# Patient Record
Sex: Female | Born: 1960 | Race: White | Hispanic: No | Marital: Married | Smoking: Never smoker
Health system: Southern US, Community
[De-identification: ages and names within clinical notes are randomized; demographics above are authoritative.]

## PROBLEM LIST (undated history)

## (undated) DIAGNOSIS — F0390 Unspecified dementia without behavioral disturbance: Secondary | ICD-10-CM

## (undated) DIAGNOSIS — E119 Type 2 diabetes mellitus without complications: Secondary | ICD-10-CM

## (undated) DIAGNOSIS — I10 Essential (primary) hypertension: Secondary | ICD-10-CM

## (undated) DIAGNOSIS — E785 Hyperlipidemia, unspecified: Secondary | ICD-10-CM

## (undated) HISTORY — PX: KNEE ARTHROSCOPY: SUR90

## (undated) HISTORY — PX: FRACTURE SURGERY: SHX138

## (undated) HISTORY — PX: ABDOMINAL HYSTERECTOMY: SHX81

## (undated) HISTORY — PX: JOINT REPLACEMENT: SHX530

## (undated) HISTORY — PX: ABDOMINAL SURGERY: SHX537

---

## 2004-12-10 ENCOUNTER — Encounter: Admission: RE | Admit: 2004-12-10 | Discharge: 2005-02-27 | Payer: Self-pay | Admitting: Orthopedic Surgery

## 2005-05-13 ENCOUNTER — Emergency Department (HOSPITAL_COMMUNITY): Admission: EM | Admit: 2005-05-13 | Discharge: 2005-05-13 | Payer: Self-pay | Admitting: *Deleted

## 2009-01-13 DIAGNOSIS — E119 Type 2 diabetes mellitus without complications: Secondary | ICD-10-CM | POA: Insufficient documentation

## 2009-01-13 DIAGNOSIS — Z96659 Presence of unspecified artificial knee joint: Secondary | ICD-10-CM | POA: Insufficient documentation

## 2009-01-13 DIAGNOSIS — Z90710 Acquired absence of both cervix and uterus: Secondary | ICD-10-CM | POA: Insufficient documentation

## 2009-01-30 ENCOUNTER — Ambulatory Visit: Payer: Self-pay | Admitting: Internal Medicine

## 2009-01-30 ENCOUNTER — Emergency Department: Payer: Self-pay

## 2012-11-28 ENCOUNTER — Ambulatory Visit: Payer: Self-pay | Admitting: Family Medicine

## 2014-01-18 DIAGNOSIS — G894 Chronic pain syndrome: Secondary | ICD-10-CM | POA: Insufficient documentation
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
## 2015-05-18 DIAGNOSIS — S46111A Strain of muscle, fascia and tendon of long head of biceps, right arm, initial encounter: Secondary | ICD-10-CM | POA: Insufficient documentation

## 2015-08-24 DIAGNOSIS — Z9889 Other specified postprocedural states: Secondary | ICD-10-CM | POA: Insufficient documentation

## 2018-01-27 ENCOUNTER — Emergency Department
Admission: EM | Admit: 2018-01-27 | Discharge: 2018-01-27 | Disposition: A | Discharge status: Home / Self Care | Payer: BLUE CROSS/BLUE SHIELD | Attending: Student in an Organized Health Care Education/Training Program | Admitting: Student in an Organized Health Care Education/Training Program

## 2018-01-27 ENCOUNTER — Other Ambulatory Visit: Payer: Self-pay

## 2018-01-27 ENCOUNTER — Emergency Department: Payer: BLUE CROSS/BLUE SHIELD

## 2018-01-27 DIAGNOSIS — R0602 Shortness of breath: Secondary | ICD-10-CM | POA: Diagnosis not present

## 2018-01-27 DIAGNOSIS — L237 Allergic contact dermatitis due to plants, except food: Secondary | ICD-10-CM

## 2018-01-27 MED ORDER — ALBUTEROL SULFATE (2.5 MG/3ML) 0.083% IN NEBU
2.5000 mg | INHALATION_SOLUTION | Freq: Once | RESPIRATORY_TRACT | Status: AC
  Administered 2018-01-27: 2.5 mg via RESPIRATORY_TRACT
  Filled 2018-01-27: qty 3

## 2018-01-27 MED ORDER — ALBUTEROL SULFATE HFA 108 (90 BASE) MCG/ACT IN AERS: 2.0000 | INHALATION_SPRAY | RESPIRATORY_TRACT | 1 refills | Status: DC | PRN

## 2018-01-27 MED ORDER — PREDNISONE 10 MG PO TABS: 50.0000 mg | ORAL_TABLET | Freq: Every day | ORAL | 0 refills | Status: DC

## 2018-01-27 MED ORDER — PREDNISONE 20 MG PO TABS
60.0000 mg | ORAL_TABLET | Freq: Once | ORAL | Status: AC
  Administered 2018-01-27: 60 mg via ORAL
  Filled 2018-01-27: qty 3
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
## 2018-01-27 NOTE — Discharge Instructions (Signed)
Please follow-up with your primary care provider for symptoms that are not improving over the next 24 to 48 hours. Return to the emergency department for symptoms of concern if unable to schedule appointment.
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
## 2021-07-10 ENCOUNTER — Telehealth: Payer: Self-pay | Admitting: Primary Care

## 2021-07-10 NOTE — Telephone Encounter (Signed)
Attempted to reach patient to offer to schedule a Palliative Consult with no answer - left message with reason for call along with my name and call back number, requesting a return call to schedule visit and also to answer any questions regarding our Palliative services.

## 2021-07-25 ENCOUNTER — Telehealth: Payer: Self-pay | Admitting: Primary Care

## 2021-07-25 NOTE — Telephone Encounter (Signed)
Spoke with Isabella Mack Gave regarding the Palliative referral/services and answered all questions and she was in agreement with scheduling visit.  I have scheduled a Telehealth Consult for 08/06/21 @ 12:30 PM.

## 2021-08-06 ENCOUNTER — Other Ambulatory Visit: Payer: Self-pay | Admitting: Primary Care

## 2021-08-06 ENCOUNTER — Other Ambulatory Visit: Payer: Self-pay

## 2021-08-06 ENCOUNTER — Encounter: Payer: Self-pay | Admitting: Primary Care

## 2021-08-06 DIAGNOSIS — Z515 Encounter for palliative care: Secondary | ICD-10-CM

## 2021-08-06 DIAGNOSIS — F0280 Dementia in other diseases classified elsewhere without behavioral disturbance: Secondary | ICD-10-CM

## 2021-08-06 NOTE — Progress Notes (Addendum)
° ° °  [facility] Palliative Care Consult Note Telephone: [phone]  Fax: [phone]     Due to the COVID-19 crisis, this visit was done via telemedicine from my office and it was initiated and consent by this patient and or family.  I connected with  Isabella Mack OR PROXY on 08/06/21 by a video enabled telemedicine application and verified that I am speaking with the correct person using two identifiers.   I discussed the limitations of evaluation and management by telemedicine. The patient expressed understanding and agreed to proceed.  Date of encounter: 08/06/21 PATIENT NAME: Isabella Mack [id number]   [phone] (home) [phone] (work) DOB: Sep 16, 1960 MRN: [id number] PRIMARY CARE PROVIDER:    Trey Paula MD [place], [place]  REFERRING PROVIDER:   Joan Mayans, MD [address] [place],  [place] [address] [phone]  RESPONSIBLE PARTY:    Contact Information     Name Relation Home Work Mobile   Isabella Mack [place]    [phone]       Palliative Care was asked to follow this patient by consultation request of  Isabella Mayans, MD to address advance care planning.                                   ASSESSMENT AND PLAN / RECOMMENDATIONS:   Advance Care Planning/Goals of Care: Goals include to maximize quality of life and symptom management. Patient/health care surrogate gave his/her permission to discuss.Our advance care planning conversation included a discussion about:    The value and importance of advance care planning  Experiences with loved ones who have been seriously ill or have died  Exploration of personal, cultural or spiritual beliefs that might influence medical decisions  Exploration of goals of care in the event of a sudden injury or illness  Identification and preparation of a healthcare agent - Wife Review  of an  advance directive document . Decision not to resuscitate or to  de-escalate disease focused treatments due to poor prognosis. CODE STATUS: DNR No most, did not want to make one at this time.  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 Patient is no longer continent of bladder and needs much assistance with adls. She is on 40 mg seroquel bid, which helps with agitation/anger. Has diabetes with a1c wnl.  She is up at night pacing and eats a lot but is losing weight.  Needs resources for care giving due to increased dementia behaviors. Wife outlines their marriage and that at that time they were encouraged to discuss advance care planning.  Gave area resources for dementia care.Wife to call back if in home visit needed.   Follow up Palliative Care Visit: Palliative care will continue to follow for complex medical decision making, advance care planning, and clarification of goals. Family will call for follow up for in home palliative care.   I spent 30 minutes providing this consultation. More than 50% of the time in this consultation was spent in counseling and care coordination.  Thank you for the opportunity to participate in the care of Ms. Isabella Mack.  The palliative care team will continue to follow. Please call our office at [phone] if we can be of additional assistance.   Isabella Coop, NP ,  [facility]

## 2021-10-09 ENCOUNTER — Other Ambulatory Visit: Payer: Self-pay

## 2021-10-09 ENCOUNTER — Emergency Department: Payer: 59

## 2021-10-09 ENCOUNTER — Emergency Department
Admission: EM | Admit: 2021-10-09 | Discharge: 2021-10-09 | Disposition: A | Discharge status: Home / Self Care | Payer: 59 | Attending: Emergency Medicine | Admitting: Emergency Medicine

## 2021-10-09 DIAGNOSIS — M7989 Other specified soft tissue disorders: Secondary | ICD-10-CM

## 2021-10-09 DIAGNOSIS — E119 Type 2 diabetes mellitus without complications: Secondary | ICD-10-CM | POA: Diagnosis not present

## 2021-10-09 DIAGNOSIS — F0390 Unspecified dementia without behavioral disturbance: Secondary | ICD-10-CM | POA: Diagnosis not present

## 2021-10-09 HISTORY — DX: Type 2 diabetes mellitus without complications: E11.9

## 2021-10-09 HISTORY — DX: Unspecified dementia, unspecified severity, without behavioral disturbance, psychotic disturbance, mood disturbance, and anxiety: F03.90

## 2021-10-09 LAB — CBC WITH DIFFERENTIAL/PLATELET
Abs Immature Granulocytes: 0.02 10*3/uL (ref 0.00–0.07)
Basophils Absolute: 0 10*3/uL (ref 0.0–0.1)
Basophils Relative: 1 %
Eosinophils Absolute: 0.1 10*3/uL (ref 0.0–0.5)
Eosinophils Relative: 2 %
HCT: 37.6 % (ref 36.0–46.0)
Hemoglobin: 12.6 g/dL (ref 12.0–15.0)
Immature Granulocytes: 0 %
Lymphocytes Relative: 23 %
Lymphs Abs: 1.5 10*3/uL (ref 0.7–4.0)
MCH: 29.7 pg (ref 26.0–34.0)
MCHC: 33.5 g/dL (ref 30.0–36.0)
MCV: 88.7 fL (ref 80.0–100.0)
Monocytes Absolute: 0.4 10*3/uL (ref 0.1–1.0)
Monocytes Relative: 7 %
Neutro Abs: 4.3 10*3/uL (ref 1.7–7.7)
Neutrophils Relative %: 67 %
Platelets: 187 10*3/uL (ref 150–400)
RBC: 4.24 MIL/uL (ref 3.87–5.11)
RDW: 12.4 % (ref 11.5–15.5)
WBC: 6.3 10*3/uL (ref 4.0–10.5)
nRBC: 0 % (ref 0.0–0.2)

## 2021-10-09 LAB — COMPREHENSIVE METABOLIC PANEL
ALT: 7 U/L (ref 0–44)
AST: 17 U/L (ref 15–41)
Albumin: 4 g/dL (ref 3.5–5.0)
Alkaline Phosphatase: 70 U/L (ref 38–126)
Anion gap: 10 (ref 5–15)
BUN: 20 mg/dL (ref 6–20)
CO2: 25 mmol/L (ref 22–32)
Calcium: 9.3 mg/dL (ref 8.9–10.3)
Chloride: 102 mmol/L (ref 98–111)
Creatinine, Ser: 0.78 mg/dL (ref 0.44–1.00)
GFR, Estimated: >60 mL/min (ref >60)
Glucose, Bld: 201 mg/dL — ABNORMAL HIGH (ref 70–99)
Potassium: 4.1 mmol/L (ref 3.5–5.1)
Sodium: 137 mmol/L (ref 135–145)
Total Bilirubin: 0.9 mg/dL (ref 0.3–1.2)
Total Protein: 7.3 g/dL (ref 6.5–8.1)

## 2021-10-09 LAB — LACTIC ACID, PLASMA
Lactic Acid, Venous: 1.5 mmol/L (ref 0.5–1.9)
Lactic Acid, Venous: 1 mmol/L (ref 0.5–1.9)

## 2021-10-09 LAB — VALPROIC ACID LEVEL: Valproic Acid Lvl: <10 ug/mL — ABNORMAL LOW (ref 50.0–100.0)

## 2021-10-09 MED ORDER — DOXYCYCLINE MONOHYDRATE 100 MG PO TABS: 100.0000 mg | ORAL_TABLET | Freq: Two times a day (BID) | ORAL | 0 refills | Status: DC

## 2021-10-09 MED ORDER — CEPHALEXIN 500 MG PO CAPS: 500.0000 mg | ORAL_CAPSULE | Freq: Four times a day (QID) | ORAL | 0 refills | Status: DC

## 2021-10-09 NOTE — Discharge Instructions (Signed)
Take Keflex 4 times daily for the next 7 days. ?Take doxycycline twice daily for the next 7 days. ?Please start probiotic. ?

## 2021-10-09 NOTE — ED Triage Notes (Addendum)
Patient to ER with wife, dementia. Patient with redness and swelling present to right lower calf/ foot. Wife reports noticing the site today. No weeping.  ? ?Patient with sciatic neuropathy/ diabetes.  ?

## 2021-10-09 NOTE — ED Provider Notes (Signed)
? ?[facility] ?Provider Note ? ?Patient Contact: 6:38 PM (approximate) ? ? ?History  ? ?Leg Swelling ? ? ?HPI ? ?Isabella Mack is a 61 y.o. female with a history of diabetes and dementia, presents to the emergency department with new right lower extremity swelling, edema and erythema.  Patient's wife reports that patient has been afebrile.  Wife reports that patient struggles with anxiety and paces often.  She does not smoke or use exogenous hormones.  No prior history of DVT or PE.  No perceived shortness of breath at home. ? ?  ? ? ?Physical Exam  ? ?Triage Vital Signs: ?ED Triage Vitals  ?Enc Vitals Group  ?   BP 10/09/21 1633 125/77  ?   Pulse Rate 10/09/21 1633 89  ?   Resp 10/09/21 1633 17  ?   Temp 10/09/21 1630 98.6 ?F (37 ?C)  ?   Temp src --   ?   SpO2 10/09/21 1633 99 %  ?   Weight 10/09/21 1750 194 lb 0.1 oz (88 kg)  ?   Height 10/09/21 1630 5\' 8"  (1.727 m)  ?   Head Circumference --   ?   Peak Flow --   ?   Pain Score --   ?   Pain Loc --   ?   Pain Edu? --   ?   Excl. in GC? --   ? ? ?Most recent vital signs: ?Vitals:  ? 10/09/21 1633 10/09/21 1800  ?BP: 125/77 120/77  ?Pulse: 89 89  ?Resp: 17 16  ?Temp:    ?SpO2: 99% 99%  ? ? ? ?General: Alert and in no acute distress. ?Eyes:  PERRL. EOMI. ?Head: No acute traumatic findings ?ENT: ?     Ears:  ?     Nose: No congestion/rhinnorhea. ?     Mouth/Throat: Mucous membranes are moist. ?Neck: No stridor. No cervical spine tenderness to palpation. ?Cardiovascular:  Good peripheral perfusion ?Respiratory: Normal respiratory effort without tachypnea or retractions. Lungs CTAB. Good air entry to the bases with no decreased or absent breath sounds. ?Gastrointestinal: Bowel sounds ?4 quadrants. Soft and nontender to palpation. No guarding or rigidity. No palpable masses. No distention. No CVA tenderness. ?Musculoskeletal: Full range of motion to all extremities.  ?Neurologic:  No gross focal neurologic deficits are appreciated.  ?Skin:  Patient has erythema and edema of the right lower extremity.  Patient has 3+ pitting edema on the right.  Palpable dorsalis pedis pulse, right. ? ? ? ?ED Results / Procedures / Treatments  ? ?Labs ?(all labs ordered are listed, but only abnormal results are displayed) ?Labs Reviewed  ?COMPREHENSIVE METABOLIC PANEL - Abnormal; Notable for the following components:  ?    Result Value  ? Glucose, Bld 201 (*)   ? All other components within normal limits  ?VALPROIC ACID LEVEL - Abnormal; Notable for the following components:  ? Valproic Acid Lvl <10 (*)   ? All other components within normal limits  ?LACTIC ACID, PLASMA  ?LACTIC ACID, PLASMA  ?CBC WITH DIFFERENTIAL/PLATELET  ?URINALYSIS, ROUTINE W REFLEX MICROSCOPIC  ? ? ? ? ? ? ?RADIOLOGY ? ?I personally viewed and evaluated these images as part of my medical decision making, as well as reviewing the written report by the radiologist. ? ?ED Provider Interpretation: I personally reviewed venous ultrasound and there was no evidence of DVT.  I agree with radiologist interpretation. ? ? ?PROCEDURES: ? ?Critical Care performed: No ? ?Procedures ? ? ?MEDICATIONS ORDERED  IN ED: ?Medications - No data to display ? ? ?IMPRESSION / MDM / ASSESSMENT AND PLAN / ED COURSE  ?I reviewed the triage vital signs and the nursing notes. ?             ?               ? ?Assessment and plan:  ?Leg swelling:  ?Differential diagnosis includes, but is not limited to, cellulitis versus DVT, subtherapeutic Depakote level ? ?61 year old female presents to the emergency department with new lower extremity edema.  On exam, patient had petechiae and 3+ pitting edema.  Patient was asymptomatic on the left. ? ?Patient had normal white blood cell count and reassuring CBC and CMP.  Lactic within range.  We will obtain venous ultrasound on the right and will reassess. ? ?Venous ultrasound shows no signs of DVT.  Suspect cellulitis at this time.  We will treat with doxycycline and Keflex.  Return  precautions were given to return to the emergency department with new or worsening symptoms. ? ?FINAL CLINICAL IMPRESSION(S) / ED DIAGNOSES  ? ?Final diagnoses:  ?Leg swelling  ? ? ? ?Rx / DC Orders  ? ?ED Discharge Orders   ? ?      Ordered  ?  doxycycline (ADOXA) 100 MG tablet  2 times daily,   Status:  Discontinued       ? 10/09/21 2031  ?  cephALEXin (KEFLEX) 500 MG capsule  4 times daily,   Status:  Discontinued       ? 10/09/21 2031  ?  cephALEXin (KEFLEX) 500 MG capsule  4 times daily       ? 10/09/21 2042  ?  doxycycline (ADOXA) 100 MG tablet  2 times daily       ? 10/09/21 2042  ? ?  ?  ? ?  ? ? ? ?Note:  This document was prepared using Dragon voice recognition software and may include unintentional dictation errors. ?  ?Orvil Feil, PA-C ?10/09/21 2318 ? ?  ?Merwyn Katos, MD ?10/10/21 2158 ? ?

## 2021-10-09 NOTE — ED Notes (Signed)
See triage note  presents with some swelling and redness to right ankle and lower leg  family states she is not aware of pt injury  2+ edema note to lower extremitry ? ?

## 2021-10-09 NOTE — ED Notes (Signed)
US at bedside

## 2021-10-13 ENCOUNTER — Other Ambulatory Visit: Payer: Self-pay

## 2021-10-13 ENCOUNTER — Inpatient Hospital Stay
Admission: EM | Admit: 2021-10-13 | Discharge: 2021-10-16 | DRG: 603 | Disposition: A | Discharge status: Home / Self Care | Payer: 59 | Attending: Internal Medicine | Admitting: Internal Medicine

## 2021-10-13 DIAGNOSIS — R233 Spontaneous ecchymoses: Secondary | ICD-10-CM | POA: Diagnosis present

## 2021-10-13 DIAGNOSIS — Z96651 Presence of right artificial knee joint: Secondary | ICD-10-CM | POA: Diagnosis present

## 2021-10-13 DIAGNOSIS — Z79899 Other long term (current) drug therapy: Secondary | ICD-10-CM

## 2021-10-13 DIAGNOSIS — E785 Hyperlipidemia, unspecified: Secondary | ICD-10-CM | POA: Diagnosis not present

## 2021-10-13 DIAGNOSIS — Z66 Do not resuscitate: Secondary | ICD-10-CM | POA: Diagnosis present

## 2021-10-13 DIAGNOSIS — E1142 Type 2 diabetes mellitus with diabetic polyneuropathy: Secondary | ICD-10-CM | POA: Diagnosis present

## 2021-10-13 DIAGNOSIS — E119 Type 2 diabetes mellitus without complications: Secondary | ICD-10-CM

## 2021-10-13 DIAGNOSIS — L03115 Cellulitis of right lower limb: Secondary | ICD-10-CM | POA: Diagnosis not present

## 2021-10-13 DIAGNOSIS — R21 Rash and other nonspecific skin eruption: Secondary | ICD-10-CM | POA: Diagnosis present

## 2021-10-13 DIAGNOSIS — Z7984 Long term (current) use of oral hypoglycemic drugs: Secondary | ICD-10-CM

## 2021-10-13 DIAGNOSIS — Z8249 Family history of ischemic heart disease and other diseases of the circulatory system: Secondary | ICD-10-CM

## 2021-10-13 DIAGNOSIS — Z7952 Long term (current) use of systemic steroids: Secondary | ICD-10-CM

## 2021-10-13 DIAGNOSIS — F0393 Unspecified dementia, unspecified severity, with mood disturbance: Secondary | ICD-10-CM | POA: Diagnosis present

## 2021-10-13 DIAGNOSIS — I10 Essential (primary) hypertension: Secondary | ICD-10-CM | POA: Diagnosis not present

## 2021-10-13 DIAGNOSIS — G8929 Other chronic pain: Secondary | ICD-10-CM | POA: Diagnosis present

## 2021-10-13 DIAGNOSIS — F0390 Unspecified dementia without behavioral disturbance: Secondary | ICD-10-CM | POA: Diagnosis present

## 2021-10-13 DIAGNOSIS — F32A Depression, unspecified: Secondary | ICD-10-CM | POA: Diagnosis present

## 2021-10-13 DIAGNOSIS — Z825 Family history of asthma and other chronic lower respiratory diseases: Secondary | ICD-10-CM

## 2021-10-13 DIAGNOSIS — R4701 Aphasia: Secondary | ICD-10-CM | POA: Diagnosis present

## 2021-10-13 DIAGNOSIS — Z888 Allergy status to other drugs, medicaments and biological substances status: Secondary | ICD-10-CM

## 2021-10-13 HISTORY — DX: Hyperlipidemia, unspecified: E78.5

## 2021-10-13 HISTORY — DX: Essential (primary) hypertension: I10

## 2021-10-13 LAB — CBC WITH DIFFERENTIAL/PLATELET
Abs Immature Granulocytes: 0.04 10*3/uL (ref 0.00–0.07)
Basophils Absolute: 0 10*3/uL (ref 0.0–0.1)
Basophils Relative: 0 %
Eosinophils Absolute: 0.1 10*3/uL (ref 0.0–0.5)
Eosinophils Relative: 1 %
HCT: 37.9 % (ref 36.0–46.0)
Hemoglobin: 12.5 g/dL (ref 12.0–15.0)
Immature Granulocytes: 0 %
Lymphocytes Relative: 9 %
Lymphs Abs: 1 10*3/uL (ref 0.7–4.0)
MCH: 29.8 pg (ref 26.0–34.0)
MCHC: 33 g/dL (ref 30.0–36.0)
MCV: 90.5 fL (ref 80.0–100.0)
Monocytes Absolute: 0.6 10*3/uL (ref 0.1–1.0)
Monocytes Relative: 5 %
Neutro Abs: 9.1 10*3/uL — ABNORMAL HIGH (ref 1.7–7.7)
Neutrophils Relative %: 85 %
Platelets: 216 10*3/uL (ref 150–400)
RBC: 4.19 MIL/uL (ref 3.87–5.11)
RDW: 12.2 % (ref 11.5–15.5)
WBC: 10.8 10*3/uL — ABNORMAL HIGH (ref 4.0–10.5)
nRBC: 0 % (ref 0.0–0.2)

## 2021-10-13 LAB — GLUCOSE, CAPILLARY
Glucose-Capillary: 165 mg/dL — ABNORMAL HIGH (ref 70–99)
Glucose-Capillary: 128 mg/dL — ABNORMAL HIGH (ref 70–99)

## 2021-10-13 LAB — PROTIME-INR
INR: 1 (ref 0.8–1.2)
Prothrombin Time: 13.5 seconds (ref 11.4–15.2)

## 2021-10-13 LAB — COMPREHENSIVE METABOLIC PANEL
ALT: 11 U/L (ref 0–44)
AST: 17 U/L (ref 15–41)
Albumin: 4.2 g/dL (ref 3.5–5.0)
Alkaline Phosphatase: 67 U/L (ref 38–126)
Anion gap: 8 (ref 5–15)
BUN: 19 mg/dL (ref 6–20)
CO2: 29 mmol/L (ref 22–32)
Calcium: 9.5 mg/dL (ref 8.9–10.3)
Chloride: 101 mmol/L (ref 98–111)
Creatinine, Ser: 0.77 mg/dL (ref 0.44–1.00)
GFR, Estimated: >60 mL/min (ref >60)
Glucose, Bld: 196 mg/dL — ABNORMAL HIGH (ref 70–99)
Potassium: 4.4 mmol/L (ref 3.5–5.1)
Sodium: 138 mmol/L (ref 135–145)
Total Bilirubin: 1 mg/dL (ref 0.3–1.2)
Total Protein: 7.4 g/dL (ref 6.5–8.1)

## 2021-10-13 LAB — HEMOGLOBIN A1C
Hgb A1c MFr Bld: 7 % — ABNORMAL HIGH (ref 4.8–5.6)
Mean Plasma Glucose: 154.2 mg/dL

## 2021-10-13 LAB — CBG MONITORING, ED: Glucose-Capillary: 149 mg/dL — ABNORMAL HIGH (ref 70–99)

## 2021-10-13 LAB — SEDIMENTATION RATE: Sed Rate: 13 mm/hr (ref 0–30)

## 2021-10-13 LAB — HIV ANTIBODY (ROUTINE TESTING W REFLEX): HIV Screen 4th Generation wRfx: NONREACTIVE

## 2021-10-13 LAB — BRAIN NATRIURETIC PEPTIDE: B Natriuretic Peptide: 13.2 pg/mL (ref 0.0–100.0)

## 2021-10-13 MED ORDER — LISINOPRIL 10 MG PO TABS
10.0000 mg | ORAL_TABLET | Freq: Every day | ORAL | Status: DC
  Administered 2021-10-13 – 2021-10-16 (×4): 10 mg via ORAL
  Filled 2021-10-13 (×4): qty 1

## 2021-10-13 MED ORDER — DULOXETINE HCL 30 MG PO CPEP
30.0000 mg | ORAL_CAPSULE | Freq: Every day | ORAL | Status: DC
  Administered 2021-10-13 – 2021-10-16 (×4): 30 mg via ORAL
  Filled 2021-10-13 (×5): qty 1

## 2021-10-13 MED ORDER — SODIUM CHLORIDE 0.9 % IV SOLN
2.0000 g | INTRAVENOUS | Status: DC
  Administered 2021-10-13 – 2021-10-15 (×3): 2 g via INTRAVENOUS
  Filled 2021-10-13: qty 20
  Filled 2021-10-13: qty 2
  Filled 2021-10-13 (×2): qty 20

## 2021-10-13 MED ORDER — ONDANSETRON HCL 4 MG/2ML IJ SOLN
4.0000 mg | Freq: Three times a day (TID) | INTRAMUSCULAR | Status: DC | PRN
  Filled 2021-10-13: qty 2

## 2021-10-13 MED ORDER — ACETAMINOPHEN 325 MG PO TABS
650.0000 mg | ORAL_TABLET | Freq: Four times a day (QID) | ORAL | Status: DC | PRN
  Filled 2021-10-13 (×2): qty 2

## 2021-10-13 MED ORDER — ENOXAPARIN SODIUM 40 MG/0.4ML IJ SOSY
40.0000 mg | PREFILLED_SYRINGE | INTRAMUSCULAR | Status: DC
  Administered 2021-10-13 – 2021-10-15 (×3): 40 mg via SUBCUTANEOUS
  Filled 2021-10-13 (×3): qty 0.4

## 2021-10-13 MED ORDER — HYDRALAZINE HCL 20 MG/ML IJ SOLN
5.0000 mg | INTRAMUSCULAR | Status: DC | PRN
  Filled 2021-10-13: qty 1

## 2021-10-13 MED ORDER — OXYCODONE-ACETAMINOPHEN 5-325 MG PO TABS
1.0000 | ORAL_TABLET | ORAL | Status: DC | PRN
Start: 2021-10-13 — End: 2021-10-16
  Administered 2021-10-13 – 2021-10-14 (×2): 1 via ORAL
  Filled 2021-10-13 (×4): qty 1

## 2021-10-13 MED ORDER — INSULIN ASPART 100 UNIT/ML IJ SOLN
0.0000 [IU] | Freq: Three times a day (TID) | INTRAMUSCULAR | Status: DC
  Administered 2021-10-13: 2 [IU] via SUBCUTANEOUS
  Administered 2021-10-14 – 2021-10-15 (×5): 1 [IU] via SUBCUTANEOUS
  Filled 2021-10-13 (×6): qty 1

## 2021-10-13 MED ORDER — PREGABALIN 75 MG PO CAPS
200.0000 mg | ORAL_CAPSULE | Freq: Two times a day (BID) | ORAL | Status: DC
  Administered 2021-10-13 – 2021-10-16 (×7): 200 mg via ORAL
  Filled 2021-10-13 (×7): qty 1

## 2021-10-13 MED ORDER — QUETIAPINE FUMARATE 25 MG PO TABS: 100.0000 mg | ORAL_TABLET | Freq: Two times a day (BID) | ORAL | Status: DC | PRN

## 2021-10-13 MED ORDER — ALBUTEROL SULFATE (2.5 MG/3ML) 0.083% IN NEBU: 3.0000 mL | INHALATION_SOLUTION | RESPIRATORY_TRACT | Status: DC | PRN

## 2021-10-13 MED ORDER — ATORVASTATIN CALCIUM 20 MG PO TABS
20.0000 mg | ORAL_TABLET | Freq: Every day | ORAL | Status: DC
  Administered 2021-10-13 – 2021-10-15 (×3): 20 mg via ORAL
  Filled 2021-10-13 (×3): qty 1

## 2021-10-13 MED ORDER — VANCOMYCIN HCL 1000 MG/200ML IV SOLN
1000.0000 mg | Freq: Two times a day (BID) | INTRAVENOUS | Status: DC
  Administered 2021-10-14 – 2021-10-16 (×5): 1000 mg via INTRAVENOUS
  Filled 2021-10-13 (×6): qty 200

## 2021-10-13 MED ORDER — INSULIN ASPART 100 UNIT/ML IJ SOLN
0.0000 [IU] | Freq: Every day | INTRAMUSCULAR | Status: DC
  Administered 2021-10-15: 2 [IU] via SUBCUTANEOUS
  Filled 2021-10-13: qty 1

## 2021-10-13 MED ORDER — VANCOMYCIN HCL 1500 MG/300ML IV SOLN
1500.0000 mg | Freq: Once | INTRAVENOUS | Status: AC
Start: 2021-10-13 — End: 2021-10-13
  Administered 2021-10-13: 1500 mg via INTRAVENOUS
  Filled 2021-10-13: qty 300

## 2021-10-13 NOTE — ED Notes (Signed)
Pt urinated in briefs; provided pericare; briefs changed; linens changed; repositioned on stretcher.  ?

## 2021-10-13 NOTE — ED Notes (Signed)
Pt declined offer of drink and food. Wife remains at bedside.  ?

## 2021-10-13 NOTE — ED Notes (Signed)
Pt currently eating from lunch tray. Wife assisting her. Denies any needs currently.  ?

## 2021-10-13 NOTE — ED Notes (Signed)
Pt currently denies pain and nausea. Pt calmly laying on stretcher. Pt's wife at bedside. Pulse at R foot noted upon using doppler. Pt's R foot and and lower leg hot; red at foot; rash noted at shin.  ?

## 2021-10-13 NOTE — ED Notes (Signed)
Pt to ED brought by wife of pt, pt has dementia, pt has peripheral neuropathy, R foot appears red and swollen since Monday (5d ago), foot appeared normal 7 days ago, R anterior calf has petechial rash. ?

## 2021-10-13 NOTE — H&P (Signed)
?History and Physical  ? ? ?Isabella Mack VEL:[id number] DOB: 1960/11/23 DOA: 10/13/2021 ? ?Referring MD/NP/PA:  ? ?PCP: Pcp, No  ? ?Patient coming from:  The patient is coming from home.  ? ? ?Chief Complaint: right lower extremity pain and swelling ? ?HPI: Isabella Mack is a 61 y.o. female with medical history significant of dementia with aphasia (patient can answer yes or no), chronic pain, peripheral neuropathy, hypertension, hyperlipidemia, diabetes mellitus, who presents with right lower extremity pain and swelling ? ?Per her wife at the bedside, patient has right lower extremity pain and swelling for almost a week, which has been progressively worsening.  The right lower leg and foot are edematous. Pt was seen in ED on Tuesday and had left leg venous Doppler which was negative for DVT.  Patient was diagnosed with cellulitis and started on doxycycline and Keflex.  Patient has been taking these medications, but symptoms have been worsening. Her right leg and foot have gotten increasingly red and swollen.  Patient does not have fever or chills. No chest pain, cough, shortness breath.  No nausea, vomiting, diarrhea or abdominal pain.  No symptoms of UTI.  Per her wife, pt's mental status is at baseline. Pt also has petechial rash in both legs. ? ?Data Reviewed and ED Course: pt was found to have WBC 10.8, INR 1.0, GFR> 60, temperature normal, blood pressure 123/69, heart rate 96, 88, RR 16, oxygen saturation 100% on room air.  Patient is placed on MedSurg bed for observation ? ? ?EKG:   Not done in ED ? ? ?Review of Systems: Could not be reviewed accurately due to dementia and aphasia. ? ? ?Allergy:  ?Allergies  ?Allergen Reactions  ? Valproic Acid Other (See Comments)  ?  Other reaction(s): Other ?Paralysis ?Paralysis ?  ? Depakote [Divalproex Sodium] Other (See Comments)  ?  Paralysis   ? ? ?Past Medical History:  ?Diagnosis Date  ? Dementia ([place])   ? Diabetes mellitus without complication ([place])   ?  Diabetes mellitus without complication ([place])   ? HLD (hyperlipidemia)   ? HTN (hypertension)   ? ? ?Past Surgical History:  ?Procedure Laterality Date  ? ABDOMINAL HYSTERECTOMY    ? ABDOMINAL SURGERY    ? FRACTURE SURGERY    ? JOINT REPLACEMENT    ? KNEE ARTHROSCOPY Right   ? ? ?Social History:  reports that she has never smoked. She has never used smokeless tobacco. She reports that she does not currently use alcohol. She reports that she does not use drugs. ? ?Family History:  ?Family History  ?Problem Relation Age of Onset  ? COPD Mother   ? Heart failure Mother   ?  ? ?Prior to Admission medications   ?Medication Sig Start Date End Date Taking? Authorizing Provider  ?albuterol (PROVENTIL HFA;VENTOLIN HFA) 108 (90 Base) MCG/ACT inhaler Inhale 2 puffs into the lungs every 4 (four) hours as needed for wheezing or shortness of breath. 01/27/18   Triplett, Dessa Phi, FNP  ?atorvastatin (LIPITOR) 20 MG tablet Take 20 mg by mouth daily. 08/02/21   [provider]  ?busPIRone (BUSPAR) 5 MG tablet Take 1 tablet by mouth 3 (three) times daily as needed. 02/21/21   [provider]  ?cephALEXin (KEFLEX) 500 MG capsule Take 1 capsule (500 mg total) by mouth 4 (four) times daily for 7 days. 10/09/21 10/16/21  Lannie Fields, PA-C  ?doxycycline (ADOXA) 100 MG tablet Take 1 tablet (100 mg total) by mouth 2 (two) times  daily for 7 days. 10/09/21 10/16/21  Lannie Fields, PA-C  ?DULoxetine (CYMBALTA) 30 MG capsule Take 1 capsule by mouth daily. 09/25/20   [provider]  ?JARDIANCE 25 MG TABS tablet Take 25 mg by mouth daily. 08/03/21   [provider]  ?lisinopril (ZESTRIL) 10 MG tablet Take 10 mg by mouth daily. 08/02/21   [provider]  ?OZEMPIC, 1 MG/DOSE, 4 MG/3ML SOPN Inject 1 mg into the skin once a week. 08/02/21   [provider]  ?predniSONE (DELTASONE) 10 MG tablet Take 5 tablets (50 mg total) by mouth daily. 01/27/18   Victorino Dike, FNP  ?pregabalin (LYRICA) 200 MG capsule  Take 200 mg by mouth 2 (two) times daily. 07/27/21   [provider]  ?QUEtiapine (SEROQUEL) 50 MG tablet TAKE 1/2 TO 1 TABLET BY MOUTH TWICE DAILY AS NEEDED FOR AGITATION 05/19/21   [provider]  ?QUEtiapine (SEROQUEL) 50 MG tablet Take 100 mg by mouth 2 (two) times daily as needed. 07/26/21   [provider]  ? ? ?Physical Exam: ?Vitals:  ? 10/13/21 1215 10/13/21 1230 10/13/21 1300 10/13/21 1315  ?BP:  138/82 (!) 151/66   ?Pulse: 98     ?Resp:      ?Temp:      ?TempSrc:      ?SpO2:    100%  ? ?General: Not in acute distress ?HEENT: ?      Eyes: PERRL, EOMI, no scleral icterus. ?      ENT: No discharge from the ears and nose, no pharynx injection, no tonsillar enlargement.  ?      Neck: No JVD, no bruit, no mass felt. ?Heme: No neck lymph node enlargement. ?Cardiac: S1/S2, RRR, No murmurs, No gallops or rubs. ?Respiratory: No rales, wheezing, rhonchi or rubs. ?GI: Soft, nondistended, nontender, no rebound pain, no organomegaly, BS present. ?GU: No hematuria ?Ext: Patient has swelling, tenderness, warmth, patchy erythema in right leg and foot.  Has petechial rash seen both legs. ? ? ? ? ?Musculoskeletal: No joint deformities, No joint redness or warmth, no limitation of ROM in spin. ?Skin: has petechial rashes in both legs ?Neuro: Alert, oriented X3, cranial nerves II-XII grossly intact, moves all extremities normally. ?Psych: Patient is not psychotic, no suicidal or hemocidal ideation. ? ?Labs on Admission: I have personally reviewed following labs and imaging studies ? ?CBC: ?Recent Labs  ?Lab 10/09/21 ?1634 10/13/21 ?1014  ?WBC 6.3 10.8*  ?NEUTROABS 4.3 9.1*  ?HGB 12.6 12.5  ?HCT 37.6 37.9  ?MCV 88.7 90.5  ?PLT 187 216  ? ?Basic Metabolic Panel: ?Recent Labs  ?Lab 10/09/21 ?1634 10/13/21 ?1014  ?NA 137 138  ?K 4.1 4.4  ?CL 102 101  ?CO2 25 29  ?GLUCOSE 201* 196*  ?BUN 20 19  ?CREATININE 0.78 0.77  ?CALCIUM 9.3 9.5  ? ?GFR: ?Estimated Creatinine Clearance: 88.9 mL/min (by C-G formula  based on SCr of 0.77 mg/dL). ?Liver Function Tests: ?Recent Labs  ?Lab 10/09/21 ?1634 10/13/21 ?1014  ?AST 17 17  ?ALT 7 11  ?ALKPHOS 70 67  ?BILITOT 0.9 1.0  ?PROT 7.3 7.4  ?ALBUMIN 4.0 4.2  ? ?No results for input(s): LIPASE, AMYLASE in the last 168 hours. ?No results for input(s): AMMONIA in the last 168 hours. ?Coagulation Profile: ?Recent Labs  ?Lab 10/13/21 ?1014  ?INR 1.0  ? ?Cardiac Enzymes: ?No results for input(s): CKTOTAL, CKMB, CKMBINDEX, TROPONINI in the last 168 hours. ?BNP (last 3 results) ?No results for input(s): PROBNP in the last  8760 hours. ?HbA1C: ?No results for input(s): HGBA1C in the last 72 hours. ?CBG: ?Recent Labs  ?Lab 10/13/21 ?1249  ?GLUCAP 149*  ? ?Lipid Profile: ?No results for input(s): CHOL, HDL, LDLCALC, TRIG, CHOLHDL, LDLDIRECT in the last 72 hours. ?Thyroid Function Tests: ?No results for input(s): TSH, T4TOTAL, FREET4, T3FREE, THYROIDAB in the last 72 hours. ?Anemia Panel: ?No results for input(s): VITAMINB12, FOLATE, FERRITIN, TIBC, IRON, RETICCTPCT in the last 72 hours. ?Urine analysis: ?No results found for: COLORURINE, APPEARANCEUR, [place], [place], [place], [place], [place], KETONESUR, PROTEINUR, [place], NITRITE, LEUKOCYTESUR ?Sepsis Labs: ?@[email or web address] (procalcitonin:4,lacticidven:4) ?)No results found for this or any previous visit (from the past 240 hour(s)).  ? ?Radiological Exams on Admission: ?No results found. ? ? ? ?Assessment/Plan ?Principal Problem: ?  Cellulitis of right lower extremity ?Active Problems: ?  Diabetes mellitus without complication ([place]) ?  HTN (hypertension) ?  Dementia ([place]) ?  HLD (hyperlipidemia) ?  Depression ?  Rash ? ? ?Cellulitis of right lower extremity: Patient failed outpatient Keflex and doxycycline treatment, symptoms have been worsening.  Has mild leukocytosis with WBC 10.8, but clinically not septic. ? ?- Placed on MedSurg bed for observation ?- Empiric antimicrobial treatment with vancomycin and Rocephin ?- PRN Zofran for  nausea, tylenol and Percocet for pain ?- Blood cultures x 2  ?- ESR and CRP ? ?Diabetes mellitus without complication [facility]): Patient is taking Ozempic and Jardiance at home. A1c was 7.3 on 10/15/13, poorly contr

## 2021-10-13 NOTE — ED Notes (Signed)
Attempted for blood cultures at L hand x1. Pt hard stick. May need to involve phlebotomy or use [place]. ?

## 2021-10-13 NOTE — ED Triage Notes (Signed)
Per pt spouse/POA, states brought the pt in Tuesday with cellulitis to the right lower leg and foot and was given PO abx, state the sx are getting worse. Pt has dementia ?

## 2021-10-13 NOTE — ED Provider Notes (Addendum)
? ?[facility] ?Provider Note ? ? ? Event Date/Time  ? First MD Initiated Contact with Patient 10/13/21 [phone]   ?  (approximate) ? ? ?History  ? ?Cellulitis ? ? ?HPI ? ?Nykita Muzzy is a 61 y.o. female with a history of dementia on Seroquel.  She comes in with her wife who tells me that they were here on Tuesday were diagnosed with cellulitis and started on doxycycline and Keflex.  Since the right leg has gotten increasingly red and swollen.  Patient is not running a fever.  There is patchy redness on the foot and petechial rash up below the knee as well.  There is a petechial rash below the other knee as well and some swelling there to but not as much as this leg and no redness. ? ?  ? ? ?Physical Exam  ? ?Triage Vital Signs: ?ED Triage Vitals [10/13/21 0934]  ?Enc Vitals Group  ?   BP 123/69  ?   Pulse Rate 80  ?   Resp 16  ?   Temp 98.1 ?F (36.7 ?C)  ?   Temp Source Oral  ?   SpO2 100 %  ?   Weight   ?   Height   ?   Head Circumference   ?   Peak Flow   ?   Pain Score   ?   Pain Loc   ?   Pain Edu?   ?   Excl. in [place]?   ? ? ?Most recent vital signs: ?Vitals:  ? 10/13/21 1422 10/13/21 1452  ?BP: (!) 97/54 (!) 111/57  ?Pulse: 83 73  ?Resp: 17   ?Temp: 98.4 ?F (36.9 ?C)   ?SpO2: 100%   ? ? ?General: Awake, no distress.  ?CV:  Good peripheral perfusion.  Heart regular rate and rhythm no audible murmurs ?Resp:  Normal effort.  Lungs are clear  ?abd:  No distention.  Soft and nontender ?Extremities: Right leg has a scar from knee replacement.  There is also scar over the ankle.  Patient's wife tells me that the patient had had the tourniquet up too long when the knee surgery was done and developed a foot drop therefore had a surgery where they took the part of the Achilles tendon and wrapped around the front of the foot to elevate the foot.  This resulted in the scar.  Patient also has some contractures of the toes and some skin breakdown between the toes and sole of the foot.  There is some  darker redness on the dorsal surface of the foot distally and some more darker redness around the ankle.  The rest of the leg is warm and pinkish and swollen up to just below the knee.  Just below the knee there is a petechial rash which does not blanch.  Its been there since at least Monday.  Patient was seen on Tuesday.  Review of the old records shows that the patient did indeed have a visit here and was put on Keflex and doxycycline which she has been taking. ? ? ?ED Results / Procedures / Treatments  ? ?Labs ?(all labs ordered are listed, but only abnormal results are displayed) ?Labs Reviewed  ?CBC WITH DIFFERENTIAL/PLATELET - Abnormal; Notable for the following components:  ?    Result Value  ? WBC 10.8 (*)   ? Neutro Abs 9.1 (*)   ? All other components within normal limits  ?COMPREHENSIVE METABOLIC PANEL - Abnormal; Notable for the  following components:  ? Glucose, Bld 196 (*)   ? All other components within normal limits  ?CBG MONITORING, ED - Abnormal; Notable for the following components:  ? Glucose-Capillary 149 (*)   ? All other components within normal limits  ?CULTURE, BLOOD (ROUTINE X 2)  ?CULTURE, BLOOD (ROUTINE X 2)  ?PROTIME-INR  ?BRAIN NATRIURETIC PEPTIDE  ?SEDIMENTATION RATE  ?C-REACTIVE PROTEIN  ?HIV ANTIBODY (ROUTINE TESTING W REFLEX)  ? ? ? ?EKG ? ? ? ? ?RADIOLOGY ? ? ? ?PROCEDURES: ? ?Critical Care performed:  ? ?Procedures ? ? ?MEDICATIONS ORDERED IN ED: ?Medications  ?oxyCODONE-acetaminophen (PERCOCET/ROXICET) 5-325 MG per tablet 1 tablet (1 tablet Oral Given 10/13/21 1307)  ?acetaminophen (TYLENOL) tablet 650 mg (has no administration in time range)  ?ondansetron (ZOFRAN) injection 4 mg (has no administration in time range)  ?hydrALAZINE (APRESOLINE) injection 5 mg (has no administration in time range)  ?enoxaparin (LOVENOX) injection 40 mg (has no administration in time range)  ?insulin aspart (novoLOG) injection 0-9 Units (0 Units Subcutaneous Patient Refused/Not Given 10/13/21 1250)   ?insulin aspart (novoLOG) injection 0-5 Units (has no administration in time range)  ?vancomycin (VANCOREADY) IVPB 1500 mg/300 mL (has no administration in time range)  ?vancomycin (VANCOREADY) IVPB 1000 mg/200 mL (has no administration in time range)  ?atorvastatin (LIPITOR) tablet 20 mg (has no administration in time range)  ?lisinopril (ZESTRIL) tablet 10 mg (has no administration in time range)  ?DULoxetine (CYMBALTA) DR capsule 30 mg (has no administration in time range)  ?QUEtiapine (SEROQUEL) tablet 100 mg (has no administration in time range)  ?pregabalin (LYRICA) capsule 200 mg (has no administration in time range)  ?albuterol (PROVENTIL) (2.5 MG/3ML) 0.083% nebulizer solution 3 mL (has no administration in time range)  ?cefTRIAXone (ROCEPHIN) 2 g in sodium chloride 0.9 % 100 mL IVPB (has no administration in time range)  ? ? ? ?IMPRESSION / MDM / ASSESSMENT AND PLAN / ED COURSE  ?I reviewed the triage vital signs and the nursing notes. ?Patient's white count is minimally elevated and her differential shows a multitude of neutrophils.  This is consistent with a possible infection.  Her platelet count is normal.  She does have a petechial rash in the lower legs but no other symptoms except for the pain and swelling in the leg.  She has had a recent ultrasound to rule out DVT within the last 4 days.  There is is no need to get another ultrasound at this point although it may be useful to repeat it if she is not any better in a few more days.  The leg is pink swollen and tender and warm which is consistent with cellulitis.  We will get her in the hospital because she we have tried to treat her outpatient with both Doxy and Keflex and has failed. ? ? ?The patient is on the cardiac monitor to evaluate for evidence of arrhythmia and/or significant heart rate changes.  None were seen ? ?  ? ? ?FINAL CLINICAL IMPRESSION(S) / ED DIAGNOSES  ? ?Final diagnoses:  ?Cellulitis of right lower extremity  ? ? ? ?Rx / DC  Orders  ? ?ED Discharge Orders   ? ? None  ? ?  ? ? ? ?Note:  This document was prepared using Dragon voice recognition software and may include unintentional dictation errors. ?  ?Nena Polio, MD ?10/13/21 1528 ? ?  ?Nena Polio, MD ?10/13/21 1529 ? ?

## 2021-10-13 NOTE — Progress Notes (Signed)
Pharmacy Antibiotic Note ? ?Isabella Mack is a 61 y.o. female w/ PMH of  diabetes and dementia admitted on 10/13/2021 with new right lower extremity swelling, edema and erythema.  Pharmacy has been consulted for vancomycin dosing. Renal function appears stable and at apparent baseline ? ?Plan: start vancomycin 1500 mg IV x 1 then 1000 mg IV every 12 hours ?Goal AUC 400-550 ?Expected AUC: 460.3 ?SCr used: 0.80 mg/dL (rounded up) ?Ke: 0.069 h-1, T1/2 10.1 h ?Daily serum creatinine while on IV vancomycin ?  ? ?Temp (24hrs), Avg:98.1 ?F (36.7 ?C), Min:98.1 ?F (36.7 ?C), Max:98.1 ?F (36.7 ?C) ? ?Recent Labs  ?Lab 10/09/21 ?1634 10/09/21 ?1840 10/13/21 ?1014  ?WBC 6.3  --  10.8*  ?CREATININE 0.78  --  0.77  ?LATICACIDVEN 1.5 1.0  --   ?  ?Estimated Creatinine Clearance: 88.9 mL/min (by C-G formula based on SCr of 0.77 mg/dL).   ? ?Allergies  ?Allergen Reactions  ? Depakote [Divalproex Sodium] Other (See Comments)  ?  Paralysis   ? ? ?Antimicrobials this admission: ?04/08 vancomycin >>  ? ?Microbiology results: ?04/08 BCx: pending ? ?Thank you for allowing pharmacy to be a part of this patient?s care. ? ?Dallie Piles ?10/13/2021 11:52 AM ? ?

## 2021-10-13 NOTE — ED Notes (Signed)
Provider at bedside examining pt. 

## 2021-10-14 DIAGNOSIS — Z96651 Presence of right artificial knee joint: Secondary | ICD-10-CM | POA: Diagnosis present

## 2021-10-14 DIAGNOSIS — E785 Hyperlipidemia, unspecified: Secondary | ICD-10-CM | POA: Diagnosis present

## 2021-10-14 DIAGNOSIS — R4701 Aphasia: Secondary | ICD-10-CM | POA: Diagnosis present

## 2021-10-14 DIAGNOSIS — Z66 Do not resuscitate: Secondary | ICD-10-CM | POA: Diagnosis present

## 2021-10-14 DIAGNOSIS — R21 Rash and other nonspecific skin eruption: Secondary | ICD-10-CM | POA: Diagnosis present

## 2021-10-14 DIAGNOSIS — E1142 Type 2 diabetes mellitus with diabetic polyneuropathy: Secondary | ICD-10-CM | POA: Diagnosis present

## 2021-10-14 DIAGNOSIS — Z79899 Other long term (current) drug therapy: Secondary | ICD-10-CM | POA: Diagnosis not present

## 2021-10-14 DIAGNOSIS — Z825 Family history of asthma and other chronic lower respiratory diseases: Secondary | ICD-10-CM | POA: Diagnosis not present

## 2021-10-14 DIAGNOSIS — Z7952 Long term (current) use of systemic steroids: Secondary | ICD-10-CM | POA: Diagnosis not present

## 2021-10-14 DIAGNOSIS — Z888 Allergy status to other drugs, medicaments and biological substances status: Secondary | ICD-10-CM | POA: Diagnosis not present

## 2021-10-14 DIAGNOSIS — G8929 Other chronic pain: Secondary | ICD-10-CM | POA: Diagnosis present

## 2021-10-14 DIAGNOSIS — Z8249 Family history of ischemic heart disease and other diseases of the circulatory system: Secondary | ICD-10-CM | POA: Diagnosis not present

## 2021-10-14 DIAGNOSIS — I10 Essential (primary) hypertension: Secondary | ICD-10-CM | POA: Diagnosis present

## 2021-10-14 DIAGNOSIS — Z7984 Long term (current) use of oral hypoglycemic drugs: Secondary | ICD-10-CM | POA: Diagnosis not present

## 2021-10-14 DIAGNOSIS — R233 Spontaneous ecchymoses: Secondary | ICD-10-CM | POA: Diagnosis present

## 2021-10-14 DIAGNOSIS — F0393 Unspecified dementia, unspecified severity, with mood disturbance: Secondary | ICD-10-CM | POA: Diagnosis present

## 2021-10-14 DIAGNOSIS — L03115 Cellulitis of right lower limb: Secondary | ICD-10-CM | POA: Diagnosis present

## 2021-10-14 LAB — CREATININE, SERUM
Creatinine, Ser: 0.63 mg/dL (ref 0.44–1.00)
GFR, Estimated: >60 mL/min (ref >60)

## 2021-10-14 LAB — CBC
HCT: 32.5 % — ABNORMAL LOW (ref 36.0–46.0)
Hemoglobin: 11 g/dL — ABNORMAL LOW (ref 12.0–15.0)
MCH: 30.4 pg (ref 26.0–34.0)
MCHC: 33.8 g/dL (ref 30.0–36.0)
MCV: 89.8 fL (ref 80.0–100.0)
Platelets: 197 10*3/uL (ref 150–400)
RBC: 3.62 MIL/uL — ABNORMAL LOW (ref 3.87–5.11)
RDW: 12.2 % (ref 11.5–15.5)
WBC: 5.7 10*3/uL (ref 4.0–10.5)
nRBC: 0 % (ref 0.0–0.2)

## 2021-10-14 LAB — GLUCOSE, CAPILLARY
Glucose-Capillary: 113 mg/dL — ABNORMAL HIGH (ref 70–99)
Glucose-Capillary: 144 mg/dL — ABNORMAL HIGH (ref 70–99)
Glucose-Capillary: 142 mg/dL — ABNORMAL HIGH (ref 70–99)
Glucose-Capillary: 141 mg/dL — ABNORMAL HIGH (ref 70–99)

## 2021-10-14 LAB — C-REACTIVE PROTEIN: CRP: 2.2 mg/dL — ABNORMAL HIGH (ref <1.0)

## 2021-10-14 MED ORDER — SODIUM CHLORIDE 0.9 % IV SOLN: INTRAVENOUS | Status: DC | PRN

## 2021-10-14 MED ORDER — INSULIN GLARGINE-YFGN 100 UNIT/ML ~~LOC~~ SOLN
5.0000 [IU] | Freq: Two times a day (BID) | SUBCUTANEOUS | Status: DC
  Administered 2021-10-14 – 2021-10-16 (×5): 5 [IU] via SUBCUTANEOUS
  Filled 2021-10-14 (×6): qty 0.05

## 2021-10-14 NOTE — Assessment & Plan Note (Signed)
Patient is taking Ozempic and Jardiance at home.  Repeat A1c at 7. ?-Add Semglee 5 units twice daily ?-Continue with sensitive sliding scale ?

## 2021-10-14 NOTE — Plan of Care (Signed)

## 2021-10-14 NOTE — Assessment & Plan Note (Signed)
Continue with home meds. 

## 2021-10-14 NOTE — Assessment & Plan Note (Signed)
Continue with core 

## 2021-10-14 NOTE — Assessment & Plan Note (Signed)
Blood pressure within goal.  Per wife patient was placed on lisinopril for renal protection with diabetes and she does not have any history of hypertension. ?-Continue with lisinopril ?

## 2021-10-14 NOTE — Progress Notes (Signed)
?Progress Note ? ? ?Patient: Isabella Mack MNO:[id number] DOB: [birth year] DOA: 10/13/2021     0 ?DOS: the patient was seen and examined on 10/14/2021 ?  ?Brief hospital course: ?Taken from H&P. ? ?Nyala Kirchner is a 61 y.o. female with medical history significant of dementia with aphasia (patient can answer yes or no), chronic pain, peripheral neuropathy, hypertension, hyperlipidemia, diabetes mellitus, who presents with right lower extremity pain and swelling ?  ?Per her wife at the bedside, patient has right lower extremity pain and swelling for almost a week, which has been progressively worsening.  The right lower leg and foot are edematous. Pt was seen in ED on Tuesday and had left leg venous Doppler which was negative for DVT.  Patient was diagnosed with cellulitis and started on doxycycline and Keflex.  Patient has been taking these medications, but symptoms have been worsening. Her right leg and foot have gotten increasingly red and swollen.  Patient does not have fever or chills. No chest pain, cough, shortness breath.  No nausea, vomiting, diarrhea or abdominal pain.  No symptoms of UTI.  Per her wife, pt's mental status is at baseline. Pt also has petechial rash in both legs. ? ?ED Course: pt was found to have WBC 10.8, INR 1.0, GFR> 60, temperature normal, blood pressure 123/69, heart rate 96, 88, RR 16, oxygen saturation 100% on room air.   ?She was started on ceftriaxone and vancomycin for cellulitis. ? ?4/9: Blood cultures remain negative.  Clinically seems improving with significant improvement in erythema and hyperthermia.  Petechial rash which involving bilateral lower legs also seems improving.  Platelets within normal limit. ? ? ?Assessment and Plan: ?* Cellulitis of right lower extremity ?Patient failed outpatient Keflex and doxycycline treatment. ?Mild leukocytosis which has been resolved today.  Blood cultures remain negative in 24 hours.  CRP elevated at 2.2 and ESR within normal limit at  13. ?-Continue with ceftriaxone and vancomycin-we can de-escalate to p.o. in 48 hours as patient seems responding. ? ?Diabetes mellitus without complication ([place]) ?Patient is taking Ozempic and Jardiance at home.  Repeat A1c at 7. ?-Add Semglee 5 units twice daily ?-Continue with sensitive sliding scale ? ?HTN (hypertension) ?Blood pressure within goal.  Per wife patient was placed on lisinopril for renal protection with diabetes and she does not have any history of hypertension. ?-Continue with lisinopril ? ?Dementia ([place]) ?- Continue with home meds ? ?HLD (hyperlipidemia) ?- Continue with Lipitor ? ?Depression ?- Continue with Cymbalta and Seroquel ? ?Rash ?Patient has petechial rash involving both lower legs, unknown etiology but it seems improving.  Platelets within normal limit. ?-Continue to monitor ? ? ?Subjective: Patient was seen and examined today.  Able to answer yes and no questions, denying any pain.  Wife at bedside who states that she seems improving. ? ?Physical Exam: ?Vitals:  ? 10/13/21 1656 10/13/21 2157 10/14/21 0557 10/14/21 0843  ?BP: 111/62 98/61 (!) 98/55 (!) 110/56  ?Pulse: 76 63 69 67  ?Resp: 15 16  17   ?Temp: 98.2 ?F (36.8 ?C)  98.3 ?F (36.8 ?C) (!) 97.4 ?F (36.3 ?C)  ?TempSrc: Oral   Oral  ?SpO2: 98% 99% 97% 100%  ?Weight:      ?Height:      ? ?General.  Well-developed lady in no acute distress.  Able to answer yes and no and appears cooperative. ?Pulmonary.  Lungs clear bilaterally, normal respiratory effort. ?CV.  Regular rate and rhythm, no JVD, rub or murmur. ?Abdomen.  Soft, nontender,  nondistended, BS positive. ?CNS.  Alert and oriented .  No focal neurologic deficit. ?Extremities.  Right lower extremity with patchy erythema and petechial rash more than left lower extremity.  No erythema of left lower extremity.  2+ edema of right lower extremity. ?Psychiatry.  Appears to have cognitive impairment ? ?Data Reviewed: ?Prior notes, labs and images reviewed ? ?Family Communication:  Discussed with wife at bedside ? ?Disposition: ?Status is: Inpatient ?Remains inpatient appropriate because: Severity of illness ? ? Planned Discharge Destination: Home ? ?DVT prophylaxis.  Lovenox ? ?Time spent: 50 minutes ? ?This record has been created using Systems analyst. Errors have been sought and corrected,but may not always be located. Such creation errors do not reflect on the standard of care. ? ?Author: ?Lorella Nimrod, MD ?10/14/2021 2:06 PM ? ?For on call review [email or web address].  ?

## 2021-10-14 NOTE — Hospital Course (Addendum)
Taken from H&P. ? ?Isabella Mack is a 61 y.o. female with medical history significant of dementia with aphasia (patient can answer yes or no), chronic pain, peripheral neuropathy, hypertension, hyperlipidemia, diabetes mellitus, who presents with right lower extremity pain and swelling ?  ?Per her wife at the bedside, patient has right lower extremity pain and swelling for almost a week, which has been progressively worsening.  The right lower leg and foot are edematous. Pt was seen in ED on Tuesday and had left leg venous Doppler which was negative for DVT.  Patient was diagnosed with cellulitis and started on doxycycline and Keflex.  Patient has been taking these medications, but symptoms have been worsening. Her right leg and foot have gotten increasingly red and swollen.  Patient does not have fever or chills. No chest pain, cough, shortness breath.  No nausea, vomiting, diarrhea or abdominal pain.  No symptoms of UTI.  Per her wife, pt's mental status is at baseline. Pt also has petechial rash in both legs. ? ?ED Course: pt was found to have WBC 10.8, INR 1.0, GFR> 60, temperature normal, blood pressure 123/69, heart rate 96, 88, RR 16, oxygen saturation 100% on room air.   ?She was started on ceftriaxone and vancomycin for cellulitis. ? ?4/9: Blood cultures remain negative.  Clinically seems improving with significant improvement in erythema and hyperthermia.  Petechial rash which involving bilateral lower legs also seems improving.  Platelets within normal limit. ? ?4/10: Patient continued to improve.  Significant improvement in erythema and edema results. ?We will continue IV antibiotics for 1 more day and discharge tomorrow on p.o. ? ?4/11: Patient continued to improve, almost complete resolution of erythema and edema.  She had more than 72 hours of IV antibiotics and being discharged on p.o. Bactrim. ? ?Patient will continue rest of her home medications and will follow-up with her providers. ?

## 2021-10-14 NOTE — Assessment & Plan Note (Signed)
Patient failed outpatient Keflex and doxycycline treatment. ?Mild leukocytosis which has been resolved today.  Blood cultures remain negative in 24 hours.  CRP elevated at 2.2 and ESR within normal limit at 13. ?-Continue with ceftriaxone and vancomycin-we can de-escalate to p.o. in 48 hours as patient seems responding. ?

## 2021-10-14 NOTE — Assessment & Plan Note (Signed)
Patient has petechial rash involving both lower legs, unknown etiology but it seems improving.  Platelets within normal limit. ?-Continue to monitor ?

## 2021-10-14 NOTE — Assessment & Plan Note (Signed)
-   Continue with Cymbalta and Seroquel ?

## 2021-10-15 LAB — GLUCOSE, CAPILLARY
Glucose-Capillary: 125 mg/dL — ABNORMAL HIGH (ref 70–99)
Glucose-Capillary: 145 mg/dL — ABNORMAL HIGH (ref 70–99)
Glucose-Capillary: 150 mg/dL — ABNORMAL HIGH (ref 70–99)
Glucose-Capillary: 204 mg/dL — ABNORMAL HIGH (ref 70–99)

## 2021-10-15 LAB — CREATININE, SERUM
Creatinine, Ser: 0.7 mg/dL (ref 0.44–1.00)
GFR, Estimated: >60 mL/min (ref >60)

## 2021-10-15 MED ORDER — POLYETHYLENE GLYCOL 3350 17 G PO PACK
17.0000 g | PACK | Freq: Every day | ORAL | Status: DC | PRN
  Administered 2021-10-15: 17 g via ORAL
  Filled 2021-10-15: qty 1

## 2021-10-15 NOTE — Assessment & Plan Note (Signed)
Patient failed outpatient Keflex and doxycycline treatment. ?Mild leukocytosis which has been resolved today.  Blood cultures remain negative in 24 hours.  CRP elevated at 2.2 and ESR within normal limit at 13. ?-Continue with ceftriaxone and vancomycin-for 1 more day, will discharge on Bactrim tomorrow. ?

## 2021-10-15 NOTE — Progress Notes (Signed)
?Progress Note ? ? ?Patient: Isabella Mack BZJ:[id number] DOB: [birth year] DOA: 10/13/2021     1 ?DOS: the patient was seen and examined on 10/15/2021 ?  ?Brief hospital course: ?Taken from H&P. ? ?Isabella Mack is a 61 y.o. female with medical history significant of dementia with aphasia (patient can answer yes or no), chronic pain, peripheral neuropathy, hypertension, hyperlipidemia, diabetes mellitus, who presents with right lower extremity pain and swelling ?  ?Per her wife at the bedside, patient has right lower extremity pain and swelling for almost a week, which has been progressively worsening.  The right lower leg and foot are edematous. Pt was seen in ED on Tuesday and had left leg venous Doppler which was negative for DVT.  Patient was diagnosed with cellulitis and started on doxycycline and Keflex.  Patient has been taking these medications, but symptoms have been worsening. Her right leg and foot have gotten increasingly red and swollen.  Patient does not have fever or chills. No chest pain, cough, shortness breath.  No nausea, vomiting, diarrhea or abdominal pain.  No symptoms of UTI.  Per her wife, pt's mental status is at baseline. Pt also has petechial rash in both legs. ? ?ED Course: pt was found to have WBC 10.8, INR 1.0, GFR> 60, temperature normal, blood pressure 123/69, heart rate 96, 88, RR 16, oxygen saturation 100% on room air.   ?She was started on ceftriaxone and vancomycin for cellulitis. ? ?4/9: Blood cultures remain negative.  Clinically seems improving with significant improvement in erythema and hyperthermia.  Petechial rash which involving bilateral lower legs also seems improving.  Platelets within normal limit. ? ?4/10: Patient continued to improve.  Significant improvement in erythema and edema results. ?We will continue IV antibiotics for 1 more day and discharge tomorrow on p.o. ? ? ?Assessment and Plan: ?* Cellulitis of right lower extremity ?Patient failed outpatient  Keflex and doxycycline treatment. ?Mild leukocytosis which has been resolved today.  Blood cultures remain negative in 24 hours.  CRP elevated at 2.2 and ESR within normal limit at 13. ?-Continue with ceftriaxone and vancomycin-for 1 more day, will discharge on Bactrim tomorrow. ? ?Diabetes mellitus without complication ([place]) ?Patient is taking Ozempic and Jardiance at home.  Repeat A1c at 7. ?-Add Semglee 5 units twice daily ?-Continue with sensitive sliding scale ? ?HTN (hypertension) ?Blood pressure within goal.  Per wife patient was placed on lisinopril for renal protection with diabetes and she does not have any history of hypertension. ?-Continue with lisinopril ? ?Dementia ([place]) ?- Continue with home meds ? ?HLD (hyperlipidemia) ?- Continue with Lipitor ? ?Depression ?- Continue with Cymbalta and Seroquel ? ?Rash ?Patient has petechial rash involving both lower legs, unknown etiology but it seems improving.  Platelets within normal limit. ?-Continue to monitor ? ? ?Subjective: Patient was seen and examined today.  No new complaints. ? ?Physical Exam: ?Vitals:  ? 10/14/21 1556 10/14/21 1954 10/15/21 0453 10/15/21 0754  ?BP: 113/61 111/62 113/62 109/61  ?Pulse: 69 66 66 65  ?Resp:  18 16 18   ?Temp: 98.1 ?F (36.7 ?C) 98 ?F (36.7 ?C) 98.2 ?F (36.8 ?C) 98.5 ?F (36.9 ?C)  ?TempSrc: Oral Oral Oral Oral  ?SpO2: 100% 99% 99% 99%  ?Weight:      ?Height:      ? ?General.     In no acute distress.  Answers only yes or no. ?Pulmonary.  Lungs clear bilaterally, normal respiratory effort. ?CV.  Regular rate and rhythm, no JVD, rub or  murmur. ?Abdomen.  Soft, nontender, nondistended, BS positive. ?CNS.  Alert and oriented .  No focal neurologic deficit. ?Extremities.  Significant improvement in erythema, no edema today, pulses intact and symmetrical. ?Psychiatry.  Judgment and insight appears normal. ? ?Data Reviewed: ?Prior notes and labs reviewed ? ?Family Communication:  ? ?Disposition: ?Status is: Inpatient ?Remains  inpatient appropriate because: Severity of illness and needing IV antibiotics for 1 more day. ? ? Planned Discharge Destination: Home ? ?DVT prophylaxis.  Lovenox ?Time spent: 40 minutes ? ?This record has been created using Systems analyst. Errors have been sought and corrected,but may not always be located. Such creation errors do not reflect on the standard of care. ? ?Author: ?Lorella Nimrod, MD ?10/15/2021 2:45 PM ? ?For on call review [email or web address].  ?

## 2021-10-15 NOTE — TOC Progression Note (Signed)
Transition of Care (TOC) - Progression Note  ? ? ?Patient Details  ?Name: Isabella Mack ?MRN: [id number] ?Date of Birth: May 12, 1961 ? ?Transition of Care (TOC) CM/SW Contact  ?Caryn Section, RN ?Phone Number: ?10/15/2021, 4:04 PM ? ?Clinical Narrative:  Spoke to patient and spouse.  Patient does have PCP, Dr. Tiburcio Pea at [facility] in [place], [place]. [address] [place].  [phone].  Patient and spouse state they have no further discharge needs at this time.  ? ? ? ?  ?  ? ?Expected Discharge Plan and Services ?  ?  ?  ?  ?  ?                ?  ?  ?  ?  ?  ?  ?  ?  ?  ?  ? ? ?Social Determinants of Health (SDOH) Interventions ?  ? ?Readmission Risk Interventions ?   ? View : No data to display.  ?  ?  ?  ? ? ?

## 2021-10-16 LAB — GLUCOSE, CAPILLARY
Glucose-Capillary: 187 mg/dL — ABNORMAL HIGH (ref 70–99)
Glucose-Capillary: 111 mg/dL — ABNORMAL HIGH (ref 70–99)
Glucose-Capillary: 130 mg/dL — ABNORMAL HIGH (ref 70–99)

## 2021-10-16 MED ORDER — SULFAMETHOXAZOLE-TRIMETHOPRIM 800-160 MG PO TABS: 1.0000 | ORAL_TABLET | Freq: Two times a day (BID) | ORAL | 0 refills | Status: AC

## 2021-10-16 NOTE — Discharge Summary (Signed)
?Physician Discharge Summary ?  ?Patient: Isabella Mack MRN: [id number] DOB: [birth year]  ?Admit date:     10/13/2021  ?Discharge date: 10/16/21  ?Discharge Physician: Lorella Nimrod  ? ?PCP: Pcp, No  ? ?Recommendations at discharge:  ?Please obtain CBC and BMP in 1 week ?Please ensure that she completed a course of antibiotic ?Follow-up with primary care provider in 1 week ? ?Discharge Diagnoses: ?Principal Problem: ?  Cellulitis of right lower extremity ?Active Problems: ?  Diabetes mellitus without complication ([place]) ?  HTN (hypertension) ?  Dementia ([place]) ?  HLD (hyperlipidemia) ?  Depression ?  Rash ? ?Hospital Course: ?Taken from H&P. ? ?Isabella Mack is a 61 y.o. female with medical history significant of dementia with aphasia (patient can answer yes or no), chronic pain, peripheral neuropathy, hypertension, hyperlipidemia, diabetes mellitus, who presents with right lower extremity pain and swelling ?  ?Per her wife at the bedside, patient has right lower extremity pain and swelling for almost a week, which has been progressively worsening.  The right lower leg and foot are edematous. Pt was seen in ED on Tuesday and had left leg venous Doppler which was negative for DVT.  Patient was diagnosed with cellulitis and started on doxycycline and Keflex.  Patient has been taking these medications, but symptoms have been worsening. Her right leg and foot have gotten increasingly red and swollen.  Patient does not have fever or chills. No chest pain, cough, shortness breath.  No nausea, vomiting, diarrhea or abdominal pain.  No symptoms of UTI.  Per her wife, pt's mental status is at baseline. Pt also has petechial rash in both legs. ? ?ED Course: pt was found to have WBC 10.8, INR 1.0, GFR> 60, temperature normal, blood pressure 123/69, heart rate 96, 88, RR 16, oxygen saturation 100% on room air.   ?She was started on ceftriaxone and vancomycin for cellulitis. ? ?4/9: Blood cultures remain negative.  Clinically  seems improving with significant improvement in erythema and hyperthermia.  Petechial rash which involving bilateral lower legs also seems improving.  Platelets within normal limit. ? ?4/10: Patient continued to improve.  Significant improvement in erythema and edema results. ?We will continue IV antibiotics for 1 more day and discharge tomorrow on p.o. ? ?4/11: Patient continued to improve, almost complete resolution of erythema and edema.  She had more than 72 hours of IV antibiotics and being discharged on p.o. Bactrim. ? ?Patient will continue rest of her home medications and will follow-up with her providers. ? ?Assessment and Plan: ?* Cellulitis of right lower extremity ?Patient failed outpatient Keflex and doxycycline treatment. ?Mild leukocytosis which has been resolved today.  Blood cultures remain negative in 24 hours.  CRP elevated at 2.2 and ESR within normal limit at 13. ?-Continue with ceftriaxone and vancomycin-for 1 more day, will discharge on Bactrim tomorrow. ? ?Diabetes mellitus without complication ([place]) ?Patient is taking Ozempic and Jardiance at home.  Repeat A1c at 7. ?-Add Semglee 5 units twice daily ?-Continue with sensitive sliding scale ? ?HTN (hypertension) ?Blood pressure within goal.  Per wife patient was placed on lisinopril for renal protection with diabetes and she does not have any history of hypertension. ?-Continue with lisinopril ? ?Dementia ([place]) ?- Continue with home meds ? ?HLD (hyperlipidemia) ?- Continue with Lipitor ? ?Depression ?- Continue with Cymbalta and Seroquel ? ?Rash ?Patient has petechial rash involving both lower legs, unknown etiology but it seems improving.  Platelets within normal limit. ?-Continue to monitor ? ? ?Consultants:  None ?Procedures performed: None ?Disposition: Home ?Diet recommendation:  ?Discharge Diet Orders (From admission, onward)  ? ?  Start     Ordered  ? 10/16/21 0000  Diet - low sodium heart healthy       ? 10/16/21 1059  ? ?  ?  ? ?   ? ?Cardiac and Carb modified diet ?DISCHARGE MEDICATION: ?Allergies as of 10/16/2021   ? ?   Reactions  ? Valproic Acid Other (See Comments)  ? Other reaction(s): Other ?Paralysis ?Paralysis  ? Depakote [divalproex Sodium] Other (See Comments)  ? Paralysis   ? ?  ? ?  ?Medication List  ?  ? ?STOP taking these medications   ? ?busPIRone 5 MG tablet ?Commonly known as: BUSPAR ?  ?cephALEXin 500 MG capsule ?Commonly known as: KEFLEX ?  ?doxycycline 100 MG tablet ?Commonly known as: ADOXA ?  ?predniSONE 10 MG tablet ?Commonly known as: DELTASONE ?  ? ?  ? ?TAKE these medications   ? ?albuterol 108 (90 Base) MCG/ACT inhaler ?Commonly known as: VENTOLIN HFA ?Inhale 2 puffs into the lungs every 4 (four) hours as needed for wheezing or shortness of breath. ?  ?atorvastatin 20 MG tablet ?Commonly known as: LIPITOR ?Take 20 mg by mouth daily. ?  ?DULoxetine 30 MG capsule ?Commonly known as: CYMBALTA ?Take 1 capsule by mouth daily. ?  ?Jardiance 25 MG Tabs tablet ?Generic drug: empagliflozin ?Take 25 mg by mouth daily. ?  ?lisinopril 10 MG tablet ?Commonly known as: ZESTRIL ?Take 10 mg by mouth daily. ?  ?Ozempic (1 MG/DOSE) 4 MG/3ML Sopn ?Generic drug: Semaglutide (1 MG/DOSE) ?Inject 1 mg into the skin once a week. ?  ?pregabalin 200 MG capsule ?Commonly known as: LYRICA ?Take 200 mg by mouth 2 (two) times daily. ?  ?QUEtiapine 50 MG tablet ?Commonly known as: SEROQUEL ?Take 100 mg by mouth 2 (two) times daily as needed. ?What changed: Another medication with the same name was removed. Continue taking this medication, and follow the directions you see here. ?  ?sulfamethoxazole-trimethoprim 800-160 MG tablet ?Commonly known as: BACTRIM DS ?Take 1 tablet by mouth 2 (two) times daily for 5 days. ?  ? ?  ? ?  ?  ? ? ?  ?Discharge Care Instructions  ?(From admission, onward)  ?  ? ? ?  ? ?  Start     Ordered  ? 10/16/21 0000  No dressing needed       ? 10/16/21 1059  ? ?  ?  ? ?  ? ? ?Discharge Exam: ?Filed Weights  ? 10/13/21  1422  ?Weight: 76.5 kg  ? ?General.     In no acute distress. ?Pulmonary.  Lungs clear bilaterally, normal respiratory effort. ?CV.  Regular rate and rhythm, no JVD, rub or murmur. ?Abdomen.  Soft, nontender, nondistended, BS positive. ?CNS.  Alert and oriented .  No focal neurologic deficit. ?Extremities.  No edema, no cyanosis, pulses intact and symmetrical. ?Psychiatry.  Judgment and insight appears normal.  ? ?Condition at discharge: stable ? ?The results of significant diagnostics from this hospitalization (including imaging, microbiology, ancillary and laboratory) are listed below for reference.  ? ?Imaging Studies: ?US Venous Img Lower Unilateral Right ? ?Result Date: 10/09/2021 ?CLINICAL DATA:  Leg swelling, erythema EXAM: RIGHT LOWER EXTREMITY VENOUS DOPPLER ULTRASOUND TECHNIQUE: Gray-scale sonography with compression, as well as color and duplex ultrasound, were performed to evaluate the deep venous system(s) from the level of the common femoral vein through the popliteal and proximal calf veins.  COMPARISON:  None. FINDINGS: VENOUS Normal compressibility of the common femoral, superficial femoral, and popliteal veins, as well as the visualized calf veins. Visualized portions of profunda femoral vein and great saphenous vein unremarkable. No filling defects to suggest DVT on grayscale or color Doppler imaging. Doppler waveforms show normal direction of venous flow, normal respiratory plasticity and response to augmentation. Limited views of the contralateral common femoral vein are unremarkable. OTHER None. Limitations: none IMPRESSION: No evidence of femoropopliteal DVT within the right lower extremity Electronically Signed   By: Fidela Salisbury M.D.   On: 10/09/2021 19:30   ? ?Microbiology: ?Results for orders placed or performed during the hospital encounter of 10/13/21  ?Culture, blood (routine x 2)     Status: None (Preliminary result)  ? Collection Time: 10/13/21  2:40 PM  ? Specimen: BLOOD  ?Result  Value Ref Range Status  ? Specimen Description BLOOD LEFT ANTECUBITAL  Final  ? Special Requests   Final  ?  BOTTLES DRAWN AEROBIC AND ANAEROBIC Blood Culture adequate volume  ? Culture   Final  ?  NO GROWTH 3 D

## 2021-10-19 LAB — CULTURE, BLOOD (ROUTINE X 2)
Culture: NO GROWTH
Culture: NO GROWTH
Special Requests: ADEQUATE
Special Requests: ADEQUATE

## 2021-10-26 NOTE — Progress Notes (Signed)
PC SW outreached patients spouse, Isabella Mack, returning her TC. ? ?Spouse shared that she is patients primary caregiver and will have to travel out of town for one day.  ? ?Spouse inquired about one-day respite stay options as they have tried to utilize a private in home caregiver and did not have the best outcome due to compatibility and patients dementia. SW informed spouse that most facilities require a 5 day respite stay.  ? ?SW and spouse the possibility of adult day programs instead due to spouse not needing to stay out of town overnight. Spouse stated that an adult day program operating hours may suffice for she and patient care needs. ? ?SW provided spouse with the following adult day programs: ?[facility]: [phone] ?[facility]: [phone] ? ? ?Spouse appreciative of call and information provided. No other needs/concerns voiced.  ?

## 2021-11-16 ENCOUNTER — Other Ambulatory Visit: Payer: Self-pay

## 2021-11-16 DIAGNOSIS — Z515 Encounter for palliative care: Secondary | ICD-10-CM

## 2021-11-16 NOTE — Progress Notes (Signed)
PATIENT NAME: Isabella Mack ?DOB: 09/27/1960 ?MRN: [id number] ? ?PRIMARY CARE PROVIDER: Pcp, No ? ?RESPONSIBLE PARTY:  ?Acct ID - Guarantor Home Phone Work Phone Relationship Acct Type  ?[id number] Nedra Hai PHILLIP* [id number] [phone] Self P/F  ?   [address], [place], [place] [address]  ? ? ?Connected by telephone with [name]-spouse who provides an update on patient status. ?[name] shares the dementia journey began about 5 years ago.  Over the last 6 months, patient has declined both cognitively and functionally.   Patient is now incontinent of bowel and bladder.  Unable to dress or feed herself.  Patient is sleeping more than she is awake.  Only a couple of issues with aggressive behaviors.  [name] advised her biggest issue are future planning.  Disability has been applied for but remains pending.  [name] would like to explore medicaid and an elder attorney.  [place], SW provided elder care attorney contact information.  [name] did not feel a visit was needed at this time.  Will continue with phone outreach. ? ? ?Truitt Merle, RN ? ? ?

## 2022-01-25 ENCOUNTER — Telehealth: Payer: Self-pay | Admitting: Primary Care

## 2022-01-25 NOTE — Telephone Encounter (Signed)
T/c to check  on patient, and offer home visit. No answer, message left.

## 2022-08-08 DEATH — deceased

## 2023-03-02 IMAGING — US US EXTREM LOW VENOUS*R*
1 series · 14 of 24 positions shown · non-contrast
Comparison: None.

CLINICAL DATA: Leg swelling, erythema

EXAM:
RIGHT LOWER EXTREMITY VENOUS DOPPLER ULTRASOUND
TECHNIQUE: Gray-scale sonography with compression, as well as color and duplex
ultrasound, were performed to evaluate the deep venous system(s)
from the level of the common femoral vein through the popliteal and
proximal calf veins.

[Series 1: us venous img lower uni right (dvt) · portal-venous · 14 of 38 slices shown]
[im 1/38]
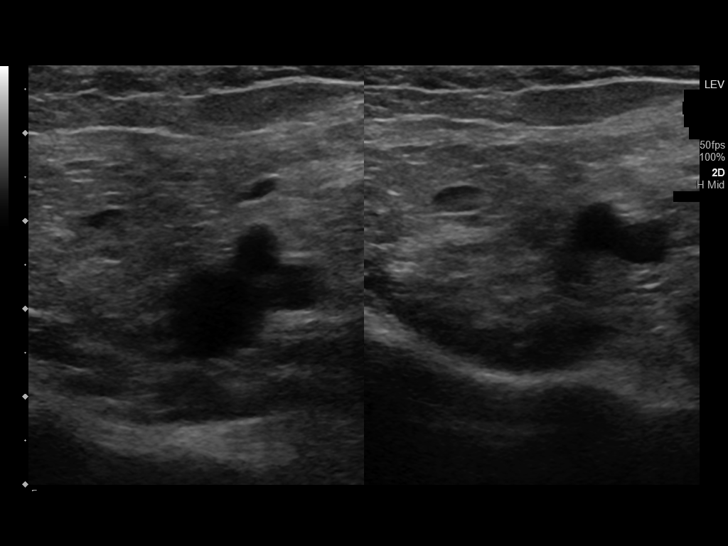
[im 4/38]
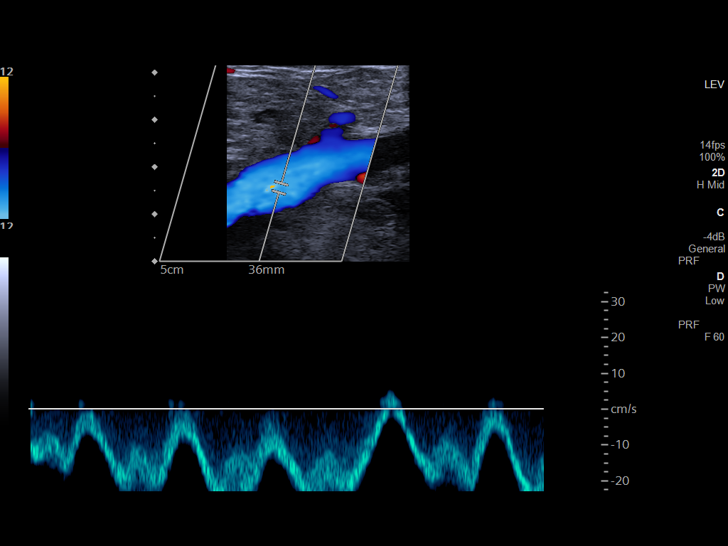
[im 7/38]
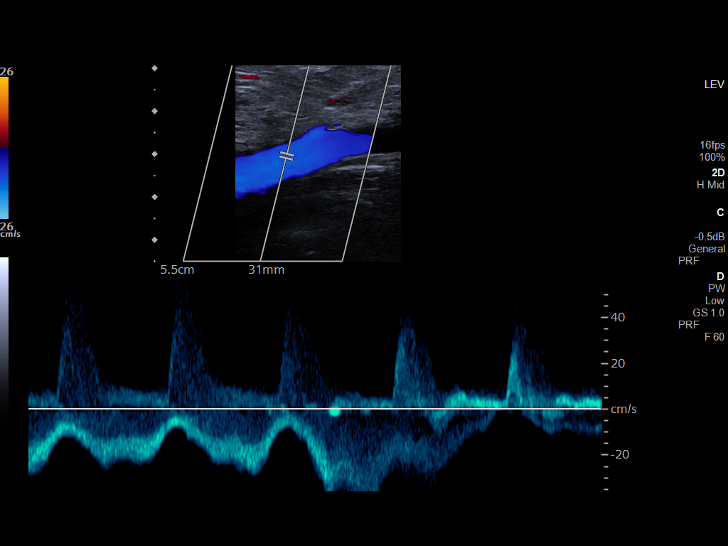
[im 10/38]
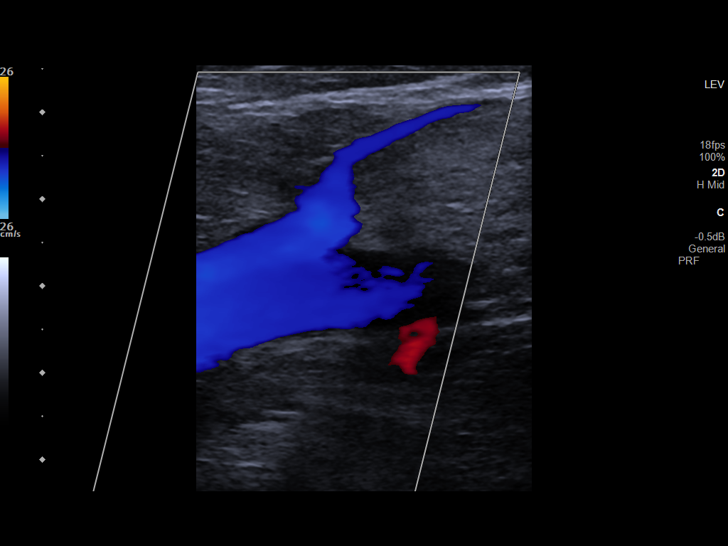
[im 12/38]
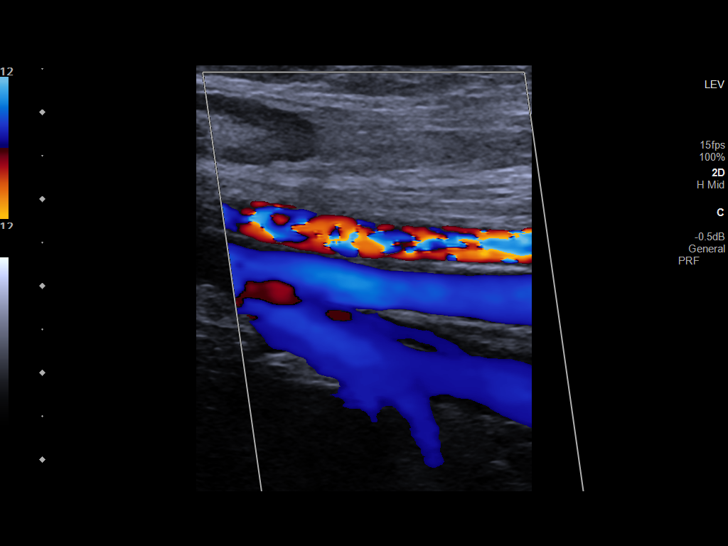
[im 15/38]
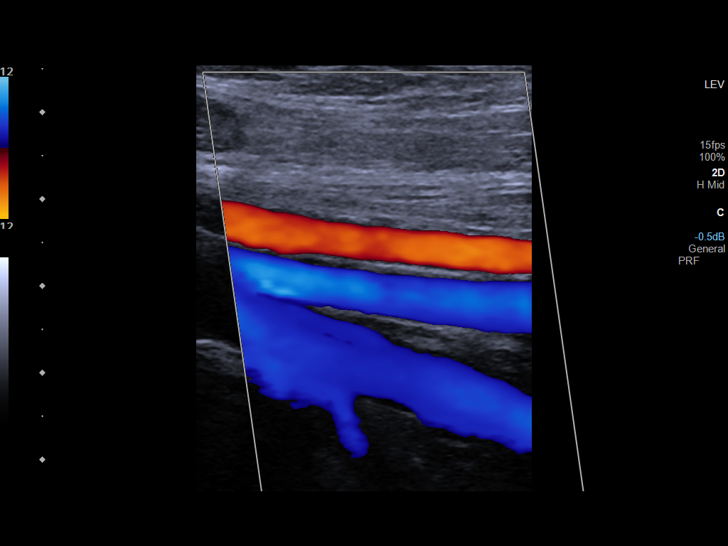
[im 18/38]
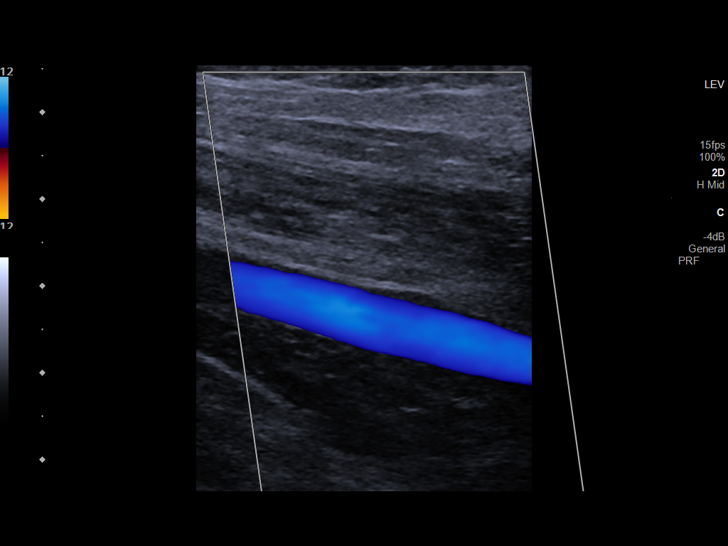
[im 20/38]
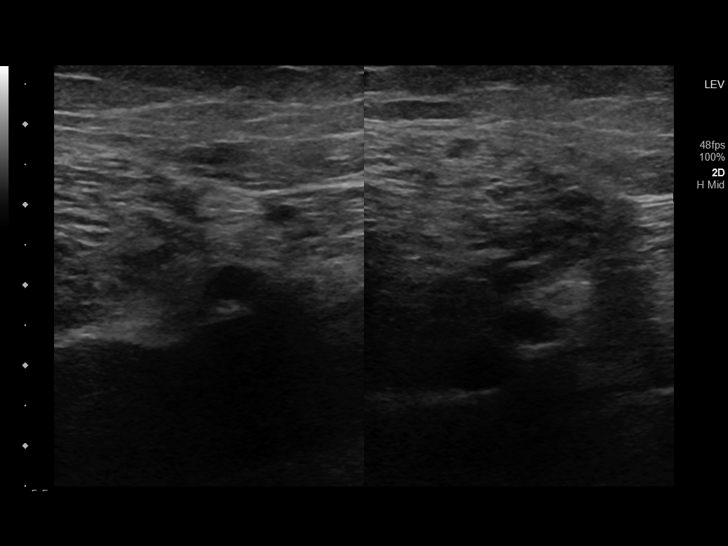
[im 23/38]
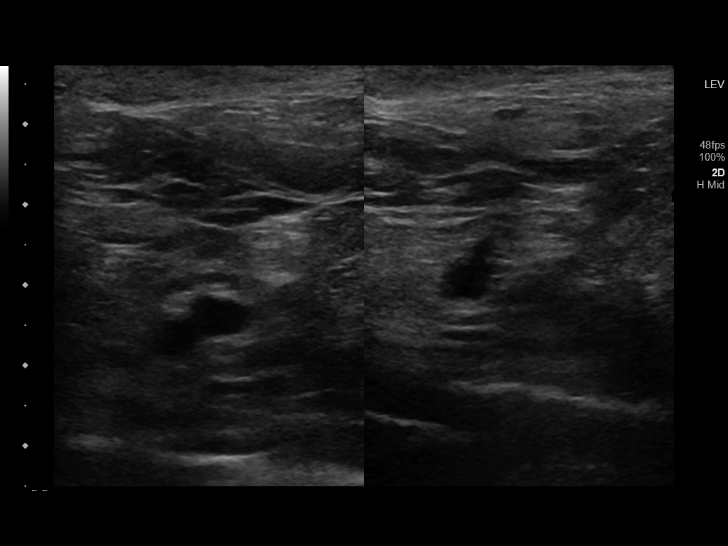
[im 26/38]
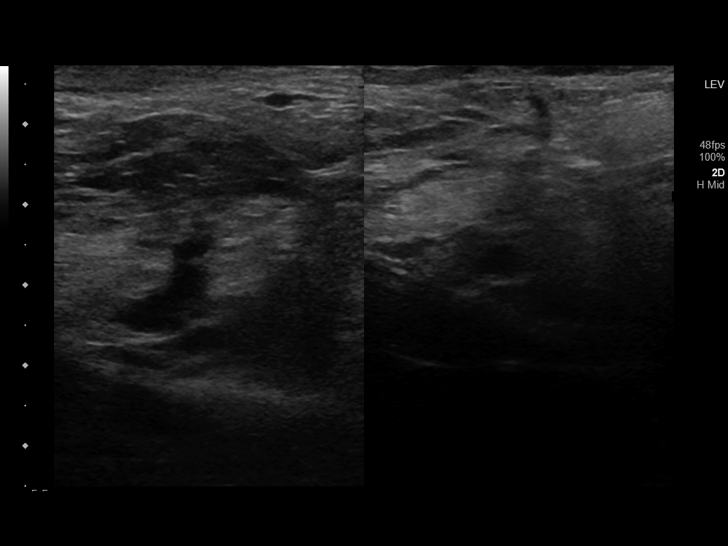
[im 29/38]
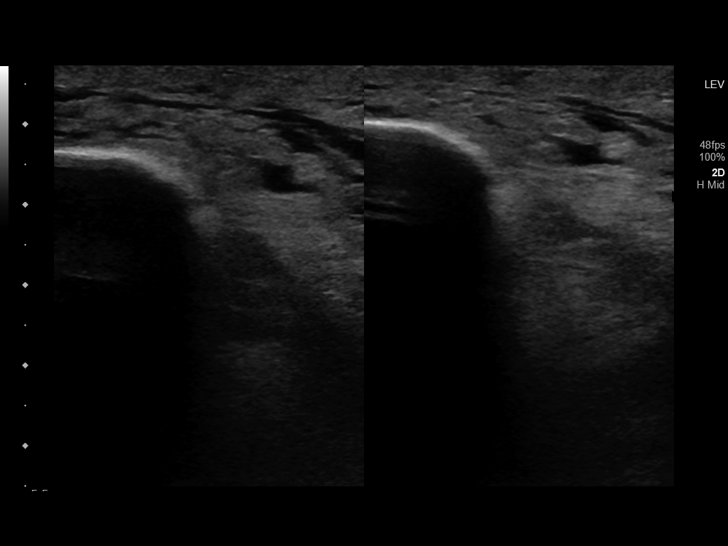
[im 31/38]
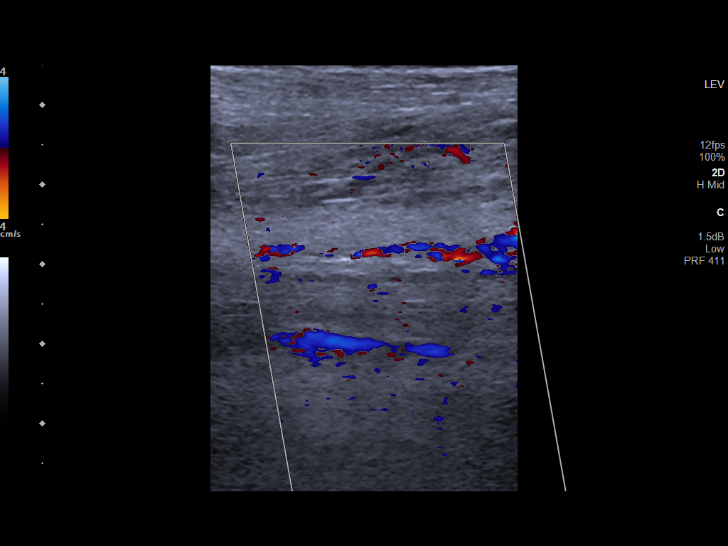
[im 34/38]
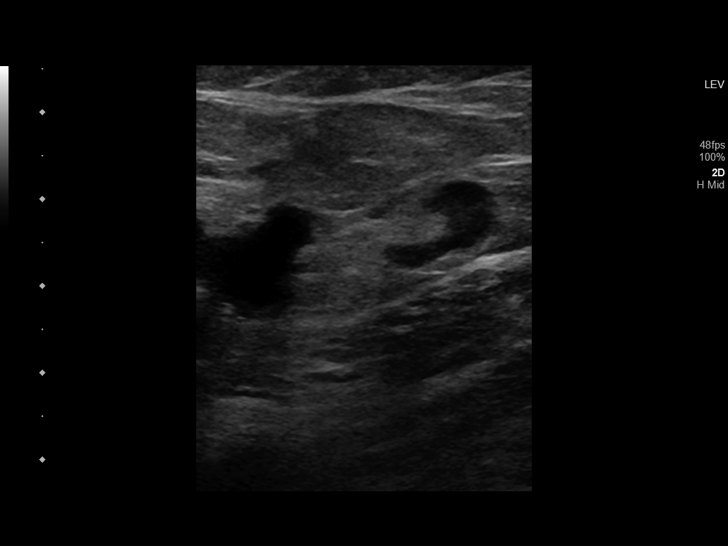
[im 38/38]
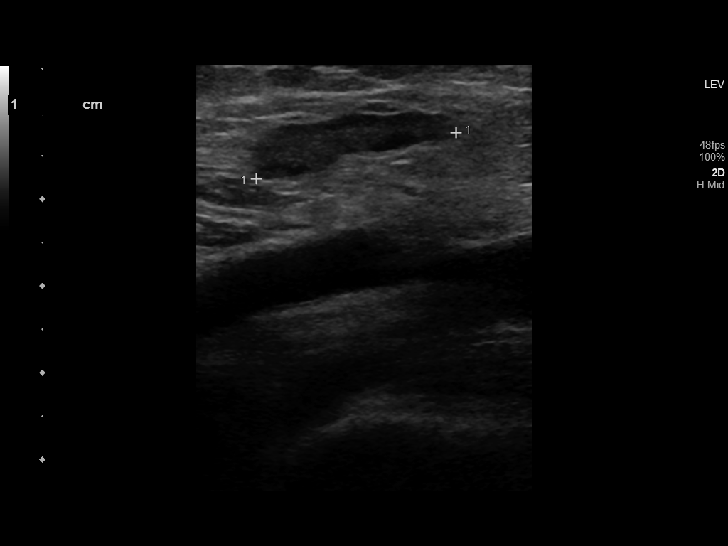

[14 of 24 positions shown; findings below may reference images not displayed]

FINDINGS: VENOUS

Normal compressibility of the common femoral, superficial femoral,
and popliteal veins, as well as the visualized calf veins.
Visualized portions of profunda femoral vein and great saphenous
vein unremarkable. No filling defects to suggest DVT on grayscale or
color Doppler imaging. Doppler waveforms show normal direction of
venous flow, normal respiratory plasticity and response to
augmentation.

Limited views of the contralateral common femoral vein are
unremarkable.

OTHER

None.

Limitations: none
IMPRESSION: No evidence of femoropopliteal DVT within the right lower extremity
